# Patient Record
Sex: Male | Born: 1960 | Race: Black or African American | Hispanic: No | Marital: Married | Smoking: Never smoker
Health system: Southern US, Community
[De-identification: ages and names within clinical notes are randomized; demographics above are authoritative.]

## PROBLEM LIST (undated history)

## (undated) DIAGNOSIS — I10 Essential (primary) hypertension: Secondary | ICD-10-CM

## (undated) HISTORY — PX: ROTATOR CUFF REPAIR: SHX139

## (undated) HISTORY — PX: ANTERIOR FUSION CERVICAL SPINE: SUR626

---

## 1998-02-19 ENCOUNTER — Ambulatory Visit (HOSPITAL_BASED_OUTPATIENT_CLINIC_OR_DEPARTMENT_OTHER): Admission: RE | Admit: 1998-02-19 | Discharge: 1998-02-19 | Payer: Self-pay | Admitting: General Surgery

## 1998-05-27 ENCOUNTER — Encounter: Admission: RE | Admit: 1998-05-27 | Discharge: 1998-06-24 | Payer: Self-pay | Admitting: General Surgery

## 1998-07-15 ENCOUNTER — Encounter: Payer: Self-pay | Admitting: Emergency Medicine

## 1998-07-15 ENCOUNTER — Emergency Department (HOSPITAL_COMMUNITY): Admission: EM | Admit: 1998-07-15 | Discharge: 1998-07-15 | Payer: Self-pay | Admitting: Emergency Medicine

## 1998-09-21 ENCOUNTER — Encounter: Payer: Self-pay | Admitting: Neurosurgery

## 1998-09-21 ENCOUNTER — Ambulatory Visit (HOSPITAL_COMMUNITY): Admission: RE | Admit: 1998-09-21 | Discharge: 1998-09-21 | Payer: Self-pay | Admitting: Neurosurgery

## 1999-05-05 ENCOUNTER — Encounter: Payer: Self-pay | Admitting: General Surgery

## 1999-05-09 ENCOUNTER — Ambulatory Visit (HOSPITAL_COMMUNITY): Admission: RE | Admit: 1999-05-09 | Discharge: 1999-05-09 | Payer: Self-pay | Admitting: General Surgery

## 1999-05-27 ENCOUNTER — Encounter (INDEPENDENT_AMBULATORY_CARE_PROVIDER_SITE_OTHER): Payer: Self-pay

## 1999-05-27 ENCOUNTER — Ambulatory Visit (HOSPITAL_BASED_OUTPATIENT_CLINIC_OR_DEPARTMENT_OTHER): Admission: RE | Admit: 1999-05-27 | Discharge: 1999-05-27 | Payer: Self-pay | Admitting: General Surgery

## 2000-03-22 ENCOUNTER — Encounter: Admission: RE | Admit: 2000-03-22 | Discharge: 2000-06-20 | Payer: Self-pay | Admitting: Orthopedic Surgery

## 2008-11-17 ENCOUNTER — Ambulatory Visit (HOSPITAL_COMMUNITY): Admission: RE | Admit: 2008-11-17 | Discharge: 2008-11-17 | Payer: Self-pay | Admitting: Neurosurgery

## 2009-10-24 ENCOUNTER — Inpatient Hospital Stay (HOSPITAL_COMMUNITY): Admission: EM | Admit: 2009-10-24 | Discharge: 2009-10-26 | Payer: Self-pay | Admitting: Emergency Medicine

## 2009-10-26 ENCOUNTER — Encounter (INDEPENDENT_AMBULATORY_CARE_PROVIDER_SITE_OTHER): Payer: Self-pay | Admitting: Internal Medicine

## 2010-08-22 LAB — POCT I-STAT, CHEM 8
BUN: 30 mg/dL — ABNORMAL HIGH (ref 6–23)
Creatinine, Ser: 3.8 mg/dL — ABNORMAL HIGH (ref 0.4–1.5)
HCT: 26 % — ABNORMAL LOW (ref 39.0–52.0)
Potassium: 3.5 mEq/L (ref 3.5–5.1)
Sodium: 136 mEq/L (ref 135–145)
TCO2: 19 mmol/L (ref 0–100)

## 2010-08-22 LAB — CARDIAC PANEL(CRET KIN+CKTOT+MB+TROPI)
CK, MB: 0.8 ng/mL (ref 0.3–4.0)
Relative Index: 0.4 (ref 0.0–2.5)
Relative Index: 0.4 (ref 0.0–2.5)
Total CK: 211 U/L (ref 7–232)
Total CK: 215 U/L (ref 7–232)
Troponin I: 0.04 ng/mL (ref 0.00–0.06)
Troponin I: 0.06 ng/mL (ref 0.00–0.06)

## 2010-08-22 LAB — RAPID URINE DRUG SCREEN, HOSP PERFORMED
Benzodiazepines: NOT DETECTED
Opiates: NOT DETECTED

## 2010-08-22 LAB — CROSSMATCH
ABO/RH(D): O POS
Antibody Screen: NEGATIVE

## 2010-08-22 LAB — CBC
HCT: 24.4 % — ABNORMAL LOW (ref 39.0–52.0)
HCT: 24.9 % — ABNORMAL LOW (ref 39.0–52.0)
Hemoglobin: 9.6 g/dL — ABNORMAL LOW (ref 13.0–17.0)
MCHC: 34.7 g/dL (ref 30.0–36.0)
MCV: 92 fL (ref 78.0–100.0)
Platelets: 179 10*3/uL (ref 150–400)
RBC: 2.71 MIL/uL — ABNORMAL LOW (ref 4.22–5.81)
RBC: 2.9 MIL/uL — ABNORMAL LOW (ref 4.22–5.81)
RDW: 12.4 % (ref 11.5–15.5)
WBC: 6.1 10*3/uL (ref 4.0–10.5)
WBC: 6.8 10*3/uL (ref 4.0–10.5)
WBC: 7.7 10*3/uL (ref 4.0–10.5)

## 2010-08-22 LAB — COMPREHENSIVE METABOLIC PANEL
AST: 65 U/L — ABNORMAL HIGH (ref 0–37)
Albumin: 2.8 g/dL — ABNORMAL LOW (ref 3.5–5.2)
Alkaline Phosphatase: 45 U/L (ref 39–117)
BUN: 11 mg/dL (ref 6–23)
BUN: 21 mg/dL (ref 6–23)
CO2: 23 mEq/L (ref 19–32)
Calcium: 7.3 mg/dL — ABNORMAL LOW (ref 8.4–10.5)
Chloride: 111 mEq/L (ref 96–112)
Chloride: 112 mEq/L (ref 96–112)
Creatinine, Ser: 1.47 mg/dL (ref 0.4–1.5)
Creatinine, Ser: 1.97 mg/dL — ABNORMAL HIGH (ref 0.4–1.5)
GFR calc Af Amer: 44 mL/min — ABNORMAL LOW (ref >60)
GFR calc non Af Amer: 51 mL/min — ABNORMAL LOW (ref >60)
Glucose, Bld: 103 mg/dL — ABNORMAL HIGH (ref 70–99)
Potassium: 4 mEq/L (ref 3.5–5.1)
Total Bilirubin: 0.7 mg/dL (ref 0.3–1.2)
Total Bilirubin: 1.3 mg/dL — ABNORMAL HIGH (ref 0.3–1.2)
Total Protein: 5.3 g/dL — ABNORMAL LOW (ref 6.0–8.3)

## 2010-08-22 LAB — DIFFERENTIAL
Basophils Relative: 0 % (ref 0–1)
Eosinophils Absolute: 0.1 10*3/uL (ref 0.0–0.7)
Lymphocytes Relative: 52 % — ABNORMAL HIGH (ref 12–46)
Monocytes Relative: 6 % (ref 3–12)
Neutro Abs: 3.1 10*3/uL (ref 1.7–7.7)
Neutrophils Relative %: 40 % — ABNORMAL LOW (ref 43–77)

## 2010-08-22 LAB — IRON AND TIBC
Saturation Ratios: 45 % (ref 20–55)
TIBC: 219 ug/dL (ref 215–435)
UIBC: 121 ug/dL

## 2010-08-22 LAB — FERRITIN: Ferritin: 1937 ng/mL — ABNORMAL HIGH (ref 22–322)

## 2010-08-22 LAB — FOLATE: Folate: >20 ng/mL

## 2010-08-22 LAB — LIPID PANEL
Cholesterol: 157 mg/dL (ref 0–200)
LDL Cholesterol: 55 mg/dL (ref 0–99)
Triglycerides: 270 mg/dL — ABNORMAL HIGH (ref <150)

## 2010-08-22 LAB — POCT CARDIAC MARKERS
Myoglobin, poc: 95.9 ng/mL (ref 12–200)
Troponin i, poc: <0.05 ng/mL (ref 0.00–0.09)

## 2010-08-22 LAB — URINALYSIS, ROUTINE W REFLEX MICROSCOPIC
Glucose, UA: NEGATIVE mg/dL
Hgb urine dipstick: NEGATIVE
Ketones, ur: NEGATIVE mg/dL
Protein, ur: NEGATIVE mg/dL
Specific Gravity, Urine: 1.01 (ref 1.005–1.030)
pH: 5.5 (ref 5.0–8.0)

## 2010-08-22 LAB — URINE CULTURE: Colony Count: NO GROWTH

## 2010-08-22 LAB — LACTIC ACID, PLASMA: Lactic Acid, Venous: 3.6 mmol/L — ABNORMAL HIGH (ref 0.5–2.2)

## 2010-08-22 LAB — CULTURE, BLOOD (ROUTINE X 2)

## 2010-08-22 LAB — MRSA PCR SCREENING: MRSA by PCR: POSITIVE — AB

## 2010-08-22 LAB — RETICULOCYTES: Retic Ct Pct: 1.5 % (ref 0.4–3.1)

## 2010-08-22 LAB — ABO/RH: ABO/RH(D): O POS

## 2010-08-22 LAB — AFP TUMOR MARKER: AFP-Tumor Marker: 5.2 ng/mL (ref 0.0–8.0)

## 2010-08-22 LAB — CK TOTAL AND CKMB (NOT AT ARMC): CK, MB: 1.2 ng/mL (ref 0.3–4.0)

## 2010-08-22 LAB — BASIC METABOLIC PANEL
GFR calc Af Amer: 27 mL/min — ABNORMAL LOW (ref >60)
GFR calc non Af Amer: 23 mL/min — ABNORMAL LOW (ref >60)

## 2010-08-22 LAB — CREATININE, URINE, RANDOM: Creatinine, Urine: 76.6 mg/dL

## 2010-08-22 LAB — SODIUM, URINE, RANDOM: Sodium, Ur: 65 mEq/L

## 2010-08-22 LAB — CEA: CEA: 0.7 ng/mL (ref 0.0–5.0)

## 2010-09-12 LAB — CBC
HCT: 39.1 % (ref 39.0–52.0)
Hemoglobin: 13.3 g/dL (ref 13.0–17.0)
MCHC: 34 g/dL (ref 30.0–36.0)
MCV: 91 fL (ref 78.0–100.0)
Platelets: 203 10*3/uL (ref 150–400)
RDW: 12.9 % (ref 11.5–15.5)

## 2010-09-12 LAB — BASIC METABOLIC PANEL
BUN: 12 mg/dL (ref 6–23)
CO2: 28 mEq/L (ref 19–32)
Chloride: 101 mEq/L (ref 96–112)
Creatinine, Ser: 1.07 mg/dL (ref 0.4–1.5)
GFR calc Af Amer: >60 mL/min (ref >60)
Glucose, Bld: 120 mg/dL — ABNORMAL HIGH (ref 70–99)

## 2010-10-18 NOTE — Op Note (Signed)
NAME:  Sean Pacheco, Sean Pacheco NO.:  [id number]   MEDICAL RECORD NO.:  [id number]          PATIENT TYPE:  OIB   LOCATION:  3533                         FACILITY:  [facility]   PHYSICIAN:  Reinaldo Meeker, M.D. 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   PROCEDURE:  C5-6 anterior cervical diskectomy with bone-bank fusion  followed by Mystique anterior cervical plating.   SURGEON:  Reinaldo Meeker, MD   ASSISTANT:  Kathaleen Maser. Pool, MD   PROCEDURE IN DETAIL:  After being placed in the supine position and 5  pounds halter traction, the patient's neck was prepped and draped in the  usual sterile fashion.  Localizing fluoroscopy was used prior to  incision to identify the appropriate level.  A transverse incision was  made in the right anterior neck starting in the midline heading towards  the medial aspect of the sternocleidomastoid muscle.  The platysma  muscle was incised transversely.  The natural fascial plane between the  strap muscles medially and the sternocleidomastoid laterally was  identified and followed down to the anterior aspect of the cervical  spine.  Longus colli muscles were identified, split in the midline,  stripped away bilaterally with Kitner dissector and unipolar  coagulation.  Fluoroscopy showed approach to the appropriate level.  Self-retaining retractor was placed and used for remainder of the case.  Using a 15 blade, the annulus of the disk was incised.  Using pituitary  rongeurs and curettes, approximately 90% of disk material was removed.  High-speed drill was used to widen the interspace.  Microscope was then  draped, brought into the field, and used for the remainder of the case.  Using microdissection technique, the  remainder of the disk material  along the posterior longitudinal ligament was removed.  The ligament was  then incised transversely, and the cut edges removed with the Kerrison  punch.  Bony overgrowth and herniated disk material which were  identified bilaterally, particularly towards the right symptomatic side.  The C6 nerve root was skeletonized, decompressed completely as it exited  the foramen.  Similar decompression was carried out on the left  asymptomatic side but not as aggressively.  At this time, inspection was  carried out in all directions for any evidence of residual compression,  none could be identified.  Large amounts of irrigation were carried out.  Any bleeding was controlled with bipolar coagulation and Gelfoam.  Measurements were taken, and 7-mm bone-bank plugs were reconstituted.  After irrigating once more and confirming hemostasis, the plugs were  impacted without difficulty.  Fluoroscopy showed it to be in excellent  position.  An appropriate length Mystique anterior cervical plate was  then chosen.  Under fluoroscopic guidance, drill holes were placed  followed by tapping, re-tapping, and placing 13-mm screws x4 until  locking mechanism was secured.  Final fluoroscopy showed the plate and  screws and plug to be in  good position.  Large amounts of irrigation were carried out.  Any bleeding controlled  by coagulation.  The wound was then closed with interrupted Vicryl on  the platysma muscle, inverted 5-0 PDS on the subcu layer, and Steri-  Strips on the skin.  A sterile dressing and soft collar were applied.  The patient was extubated, taken to recovery room in stable condition.           ______________________________  Reinaldo Meeker, M.D.     ROK/MEDQ  D:  11/17/2008  T:  11/18/2008  Job:  [id number]

## 2012-12-06 ENCOUNTER — Encounter (HOSPITAL_BASED_OUTPATIENT_CLINIC_OR_DEPARTMENT_OTHER): Payer: Self-pay | Admitting: *Deleted

## 2012-12-06 ENCOUNTER — Emergency Department (HOSPITAL_BASED_OUTPATIENT_CLINIC_OR_DEPARTMENT_OTHER): Payer: No Typology Code available for payment source

## 2012-12-06 ENCOUNTER — Emergency Department (HOSPITAL_BASED_OUTPATIENT_CLINIC_OR_DEPARTMENT_OTHER)
Admission: EM | Admit: 2012-12-06 | Discharge: 2012-12-06 | Disposition: A | Discharge status: Home / Self Care | Payer: No Typology Code available for payment source | Attending: Emergency Medicine | Admitting: Emergency Medicine

## 2012-12-06 DIAGNOSIS — S161XXA Strain of muscle, fascia and tendon at neck level, initial encounter: Secondary | ICD-10-CM

## 2012-12-06 DIAGNOSIS — S139XXA Sprain of joints and ligaments of unspecified parts of neck, initial encounter: Secondary | ICD-10-CM | POA: Insufficient documentation

## 2012-12-06 DIAGNOSIS — Z79899 Other long term (current) drug therapy: Secondary | ICD-10-CM | POA: Insufficient documentation

## 2012-12-06 DIAGNOSIS — IMO0002 Reserved for concepts with insufficient information to code with codable children: Secondary | ICD-10-CM | POA: Insufficient documentation

## 2012-12-06 DIAGNOSIS — Z9889 Other specified postprocedural states: Secondary | ICD-10-CM | POA: Insufficient documentation

## 2012-12-06 DIAGNOSIS — Y9389 Activity, other specified: Secondary | ICD-10-CM | POA: Insufficient documentation

## 2012-12-06 DIAGNOSIS — Y92410 Unspecified street and highway as the place of occurrence of the external cause: Secondary | ICD-10-CM | POA: Insufficient documentation

## 2012-12-06 DIAGNOSIS — S46911A Strain of unspecified muscle, fascia and tendon at shoulder and upper arm level, right arm, initial encounter: Secondary | ICD-10-CM

## 2012-12-06 DIAGNOSIS — R209 Unspecified disturbances of skin sensation: Secondary | ICD-10-CM | POA: Insufficient documentation

## 2012-12-06 DIAGNOSIS — Z981 Arthrodesis status: Secondary | ICD-10-CM | POA: Insufficient documentation

## 2012-12-06 MED ORDER — HYDROCODONE-ACETAMINOPHEN 5-325 MG PO TABS: 2.0000 | ORAL_TABLET | ORAL | Status: DC | PRN

## 2012-12-06 MED ORDER — CYCLOBENZAPRINE HCL 10 MG PO TABS: 10.0000 mg | ORAL_TABLET | Freq: Two times a day (BID) | ORAL | Status: AC | PRN

## 2012-12-06 MED ORDER — IBUPROFEN 800 MG PO TABS
800.0000 mg | ORAL_TABLET | Freq: Once | ORAL | Status: AC
  Administered 2012-12-06: 800 mg via ORAL
  Filled 2012-12-06: qty 1

## 2012-12-06 MED ORDER — IBUPROFEN 800 MG PO TABS: 800.0000 mg | ORAL_TABLET | Freq: Three times a day (TID) | ORAL | Status: AC

## 2012-12-06 NOTE — ED Notes (Signed)
Patient transported to CT 
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
## 2015-02-21 ENCOUNTER — Emergency Department (HOSPITAL_BASED_OUTPATIENT_CLINIC_OR_DEPARTMENT_OTHER)
Admission: EM | Admit: 2015-02-21 | Discharge: 2015-02-21 | Disposition: A | Discharge status: Home / Self Care | Payer: BLUE CROSS/BLUE SHIELD | Attending: Emergency Medicine | Admitting: Emergency Medicine

## 2015-02-21 ENCOUNTER — Encounter (HOSPITAL_BASED_OUTPATIENT_CLINIC_OR_DEPARTMENT_OTHER): Payer: Self-pay | Admitting: Emergency Medicine

## 2015-02-21 DIAGNOSIS — I10 Essential (primary) hypertension: Secondary | ICD-10-CM | POA: Insufficient documentation

## 2015-02-21 DIAGNOSIS — B029 Zoster without complications: Secondary | ICD-10-CM | POA: Diagnosis not present

## 2015-02-21 DIAGNOSIS — Z79899 Other long term (current) drug therapy: Secondary | ICD-10-CM | POA: Insufficient documentation

## 2015-02-21 DIAGNOSIS — R079 Chest pain, unspecified: Secondary | ICD-10-CM | POA: Diagnosis not present

## 2015-02-21 DIAGNOSIS — R21 Rash and other nonspecific skin eruption: Secondary | ICD-10-CM | POA: Diagnosis present

## 2015-02-21 DIAGNOSIS — Z791 Long term (current) use of non-steroidal anti-inflammatories (NSAID): Secondary | ICD-10-CM | POA: Insufficient documentation

## 2015-02-21 HISTORY — DX: Essential (primary) hypertension: I10

## 2015-02-21 MED ORDER — PREDNISONE 50 MG PO TABS
60.0000 mg | ORAL_TABLET | Freq: Once | ORAL | Status: AC
  Administered 2015-02-21: 60 mg via ORAL
  Filled 2015-02-21 (×2): qty 1

## 2015-02-21 MED ORDER — VALACYCLOVIR HCL 1 G PO TABS
1000.0000 mg | ORAL_TABLET | Freq: Three times a day (TID) | ORAL | Status: AC
Start: 2015-02-21 — End: 2015-02-28

## 2015-02-21 MED ORDER — HYDROCODONE-ACETAMINOPHEN 5-325 MG PO TABS: 2.0000 | ORAL_TABLET | ORAL | Status: AC | PRN

## 2015-02-21 NOTE — ED Notes (Signed)
Pt states he was working in yard Thursday, noticed bump to mid back on left, since painful rash developed spreading to flank and left upper abdomen

## 2015-02-21 NOTE — Discharge Instructions (Signed)
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
## 2018-10-25 ENCOUNTER — Other Ambulatory Visit: Payer: Self-pay | Admitting: Orthopedic Surgery

## 2018-10-25 ENCOUNTER — Other Ambulatory Visit (HOSPITAL_COMMUNITY): Payer: Self-pay | Admitting: Orthopedic Surgery

## 2018-10-25 DIAGNOSIS — M25511 Pain in right shoulder: Secondary | ICD-10-CM

## 2018-11-21 ENCOUNTER — Inpatient Hospital Stay: Admission: RE | Admit: 2018-11-21 | Payer: BLUE CROSS/BLUE SHIELD | Source: Ambulatory Visit

## 2018-11-21 ENCOUNTER — Other Ambulatory Visit: Payer: BLUE CROSS/BLUE SHIELD
